# Patient Record
Sex: Female | Born: 1945 | Race: Black or African American | Hispanic: No | Smoking: Never smoker
Health system: Southern US, Community
[De-identification: ages and names within clinical notes are randomized; demographics above are authoritative.]

---

## 2004-11-26 ENCOUNTER — Ambulatory Visit: Payer: Self-pay | Admitting: Obstetrics and Gynecology

## 2005-11-29 ENCOUNTER — Emergency Department: Payer: Self-pay | Admitting: Unknown Physician Specialty

## 2005-12-03 ENCOUNTER — Ambulatory Visit: Payer: Self-pay | Admitting: Obstetrics and Gynecology

## 2005-12-22 ENCOUNTER — Ambulatory Visit: Payer: Self-pay | Admitting: Obstetrics and Gynecology

## 2006-05-25 ENCOUNTER — Ambulatory Visit: Payer: Self-pay

## 2006-10-24 ENCOUNTER — Emergency Department: Payer: Self-pay | Admitting: Emergency Medicine

## 2007-01-18 ENCOUNTER — Ambulatory Visit: Payer: Self-pay | Admitting: Obstetrics and Gynecology

## 2007-03-22 ENCOUNTER — Ambulatory Visit: Payer: Self-pay | Admitting: Gastroenterology

## 2007-06-03 IMAGING — US US CAROTID DUPLEX BILAT
1 series · 17 of 24 positions shown · non-contrast
Comparison: none

REASON FOR EXAM: Bruit
COMMENTS:

[Series 1: us carotid duplex bilat · 17 of 54 slices shown]
[im 1/54]
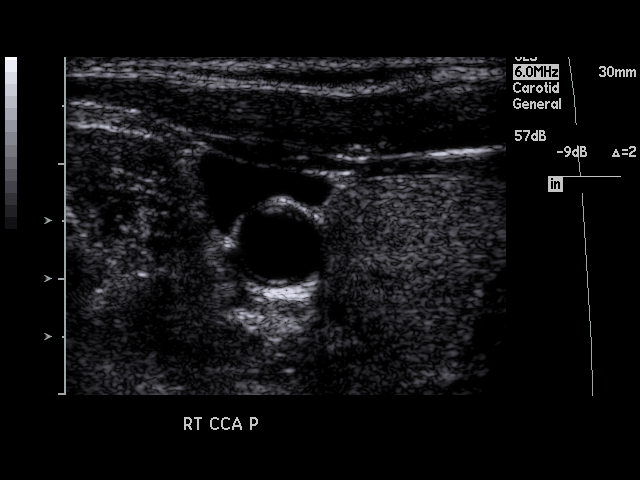
[im 5/54]
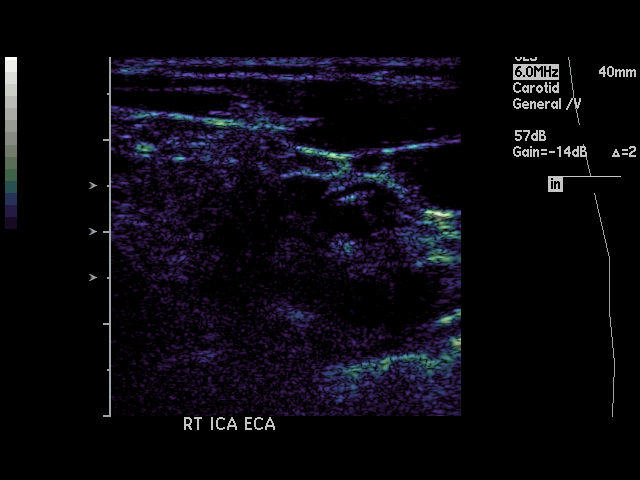
[im 7/54]
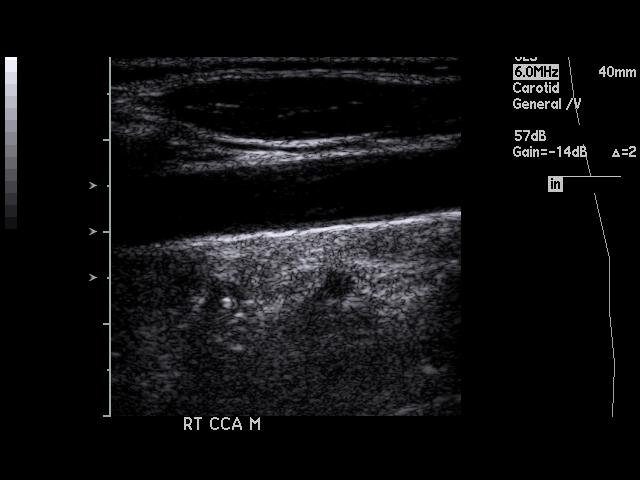
[im 10/54]
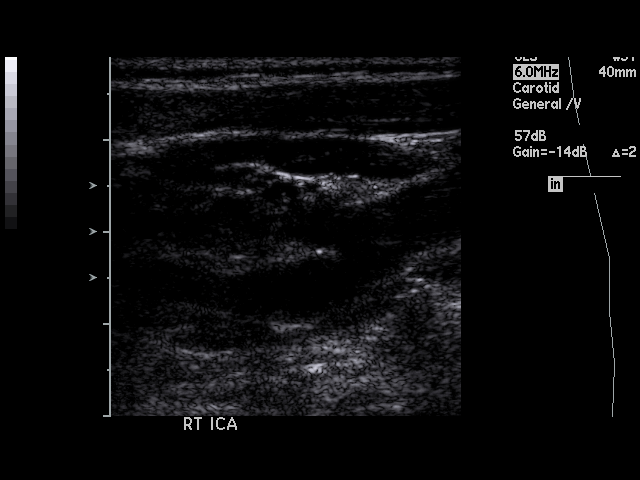
[im 14/54]
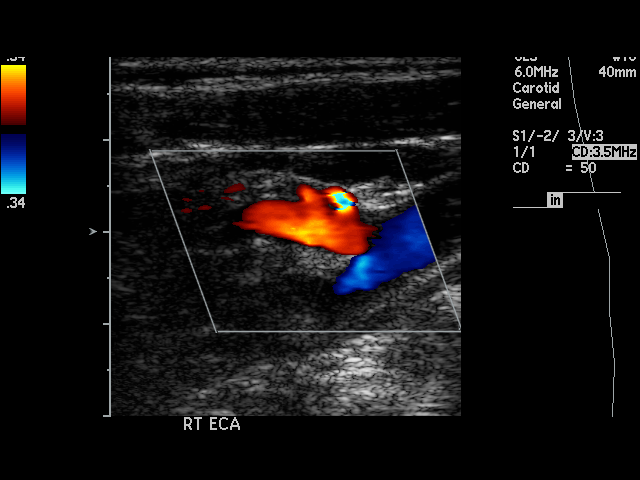
[im 17/54]
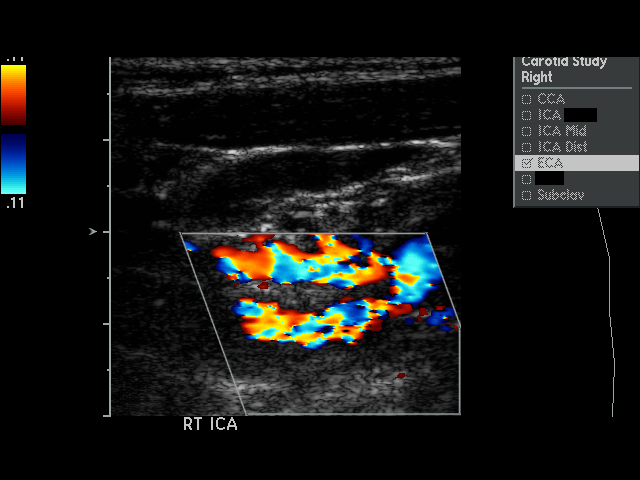
[im 21/54]
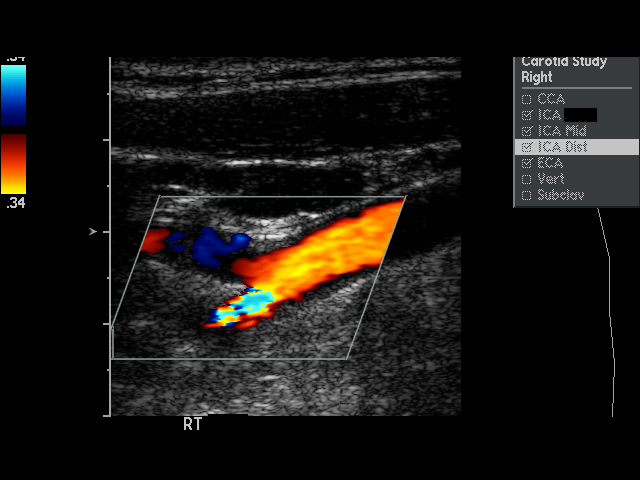
[im 24/54]
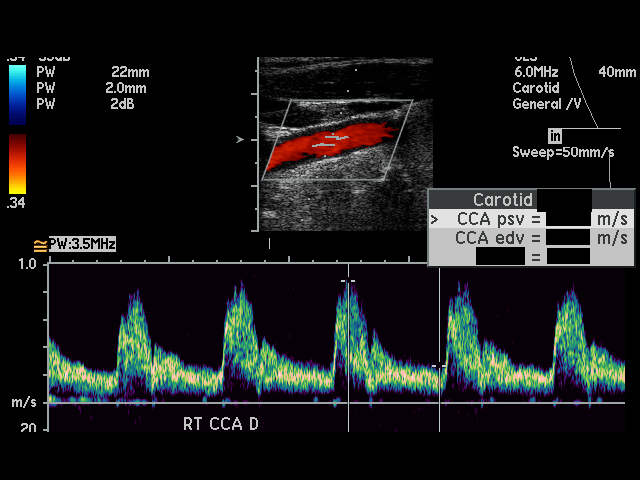
[im 28/54]
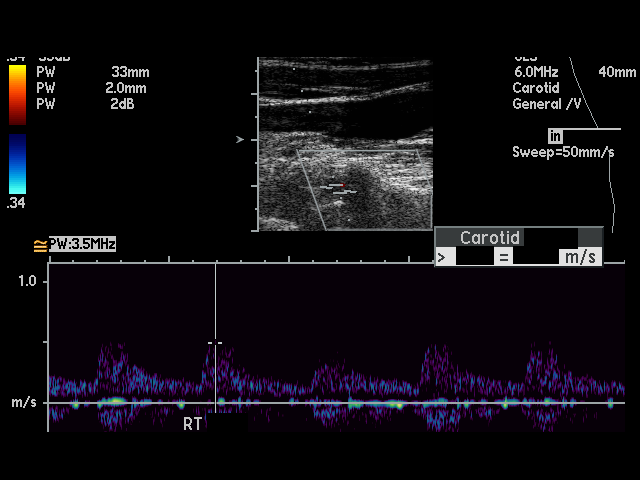
[im 30/54]
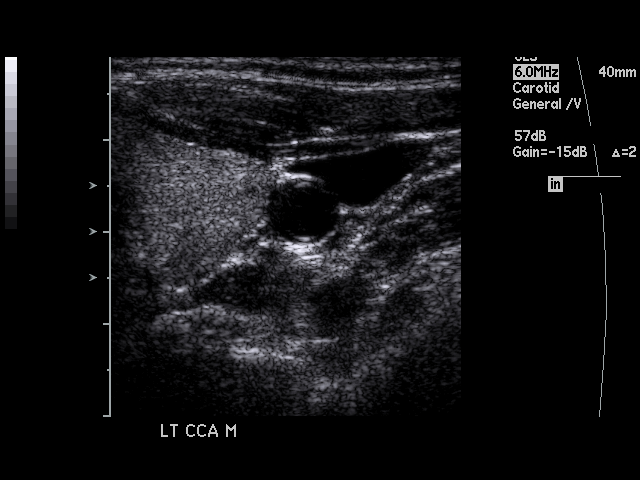
[im 33/54]
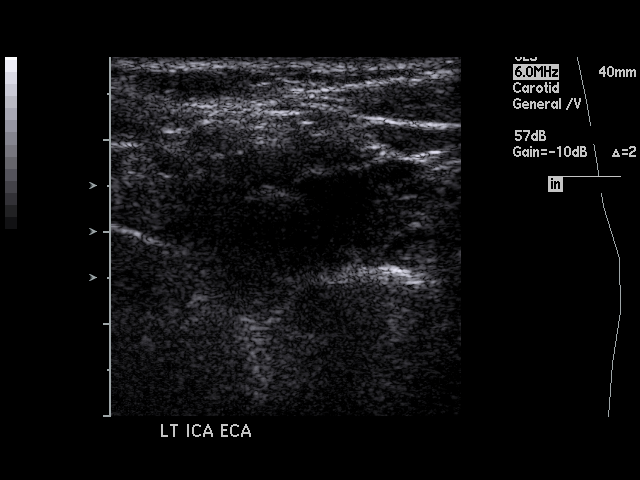
[im 37/54]
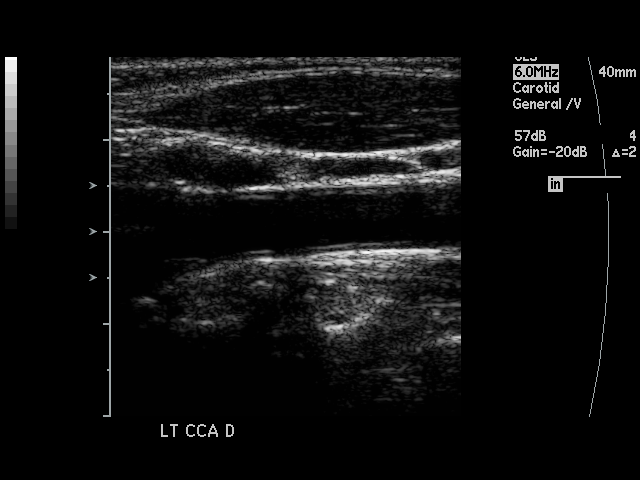
[im 40/54]
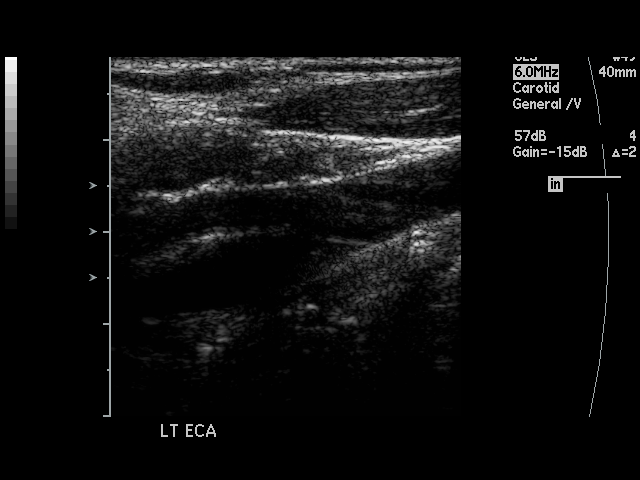
[im 44/54]
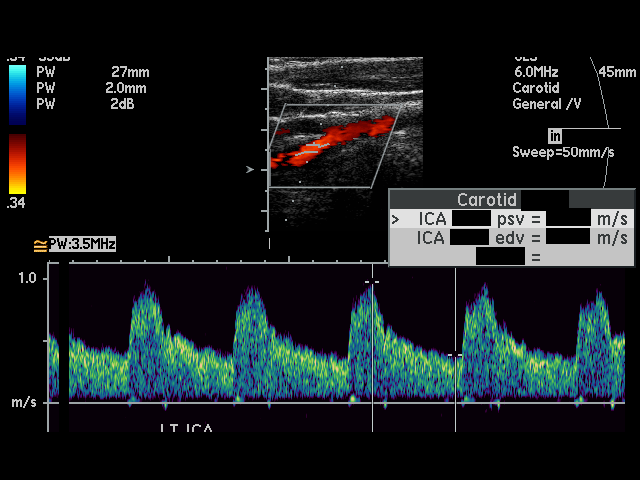
[im 47/54]
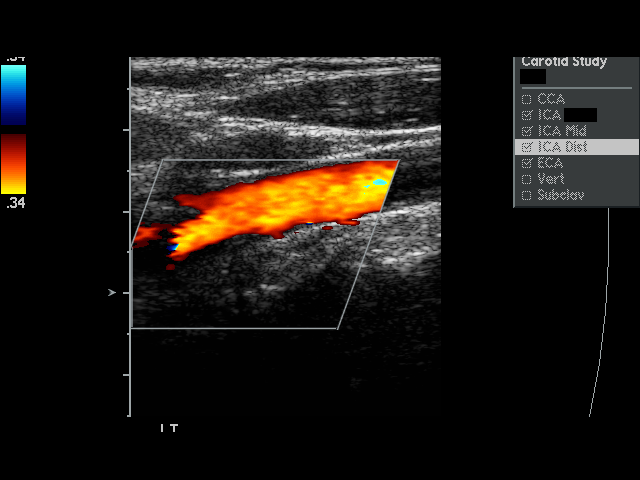
[im 49/54]
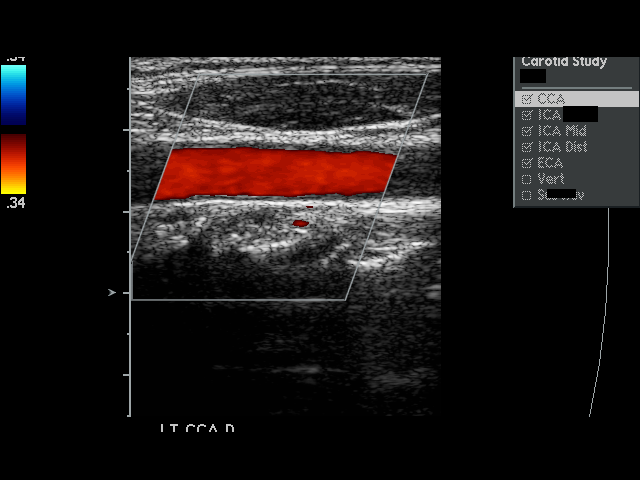
[im 54/54]
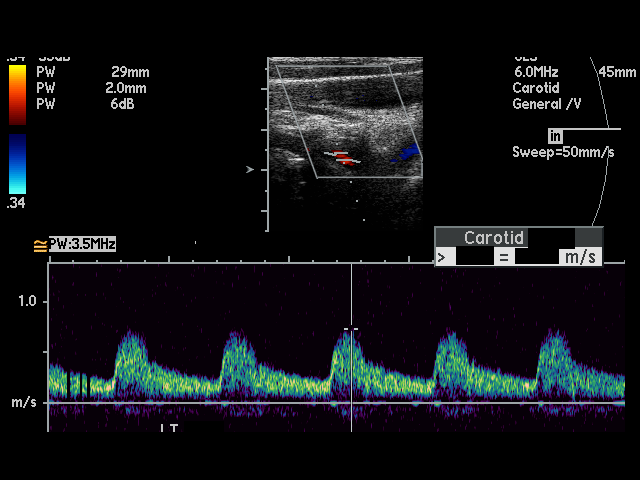

[17 of 24 positions shown; findings below may reference images not displayed]

PROCEDURE:     US  - US CAROTID DOPPLER BILATERAL  - May 25, 2006  [DATE]

RESULT:     There is observed mild soft plaque formation at the carotid
bifurcation on the LEFT. No definite plaque formation is seen on the RIGHT.

On the RIGHT, the peak RIGHT common carotid artery flow velocity measures
0.886 meters per second and the peak RIGHT internal carotid artery flow
velocity measures 1.000 meters per second. The IC:CC ratio is 1.129.

On the LEFT, the peak LEFT common carotid artery flow velocity measures
0.959 meters per second and the peak LEFT internal carotid artery flow
velocity measures 0.979 meters per second. The IC:CC ratio is 1.021.

These values bilaterally are in the normal range and are compatible with the
absence of hemodynamically significant stenosis.

Antegrade flow is seen in both vertebrals.
IMPRESSION: 1.  There is mild soft plaque formation at the carotid bifurcation on the
LEFT.
2.  No dynamically significant stenosis is seen on either side.
3.  Antegrade flow is present in both vertebrals.

## 2008-01-24 ENCOUNTER — Ambulatory Visit: Payer: Self-pay | Admitting: Obstetrics and Gynecology

## 2008-12-14 ENCOUNTER — Ambulatory Visit: Payer: Self-pay | Admitting: Family Medicine

## 2009-01-29 ENCOUNTER — Ambulatory Visit: Payer: Self-pay | Admitting: Obstetrics and Gynecology

## 2009-04-12 ENCOUNTER — Emergency Department: Payer: Self-pay | Admitting: Emergency Medicine

## 2009-04-26 ENCOUNTER — Ambulatory Visit: Payer: Self-pay | Admitting: Family Medicine

## 2009-12-23 IMAGING — CR LEFT WRIST - COMPLETE 3+ VIEW
1 series · 4 of 4 positions shown · non-contrast
Comparison: none

REASON FOR EXAM: injury, pain
COMMENTS:

PROCEDURE:     KDR - KDXR WRIST LT COMP WITH OBLIQUES  - December 14, 2008 [DATE]
RESULT:     Left wrist evaluation dated 12/14/2008

[Series 2: view not recorded · 0.17mm/px · 4 of 4 slices shown]
[im 1/4]
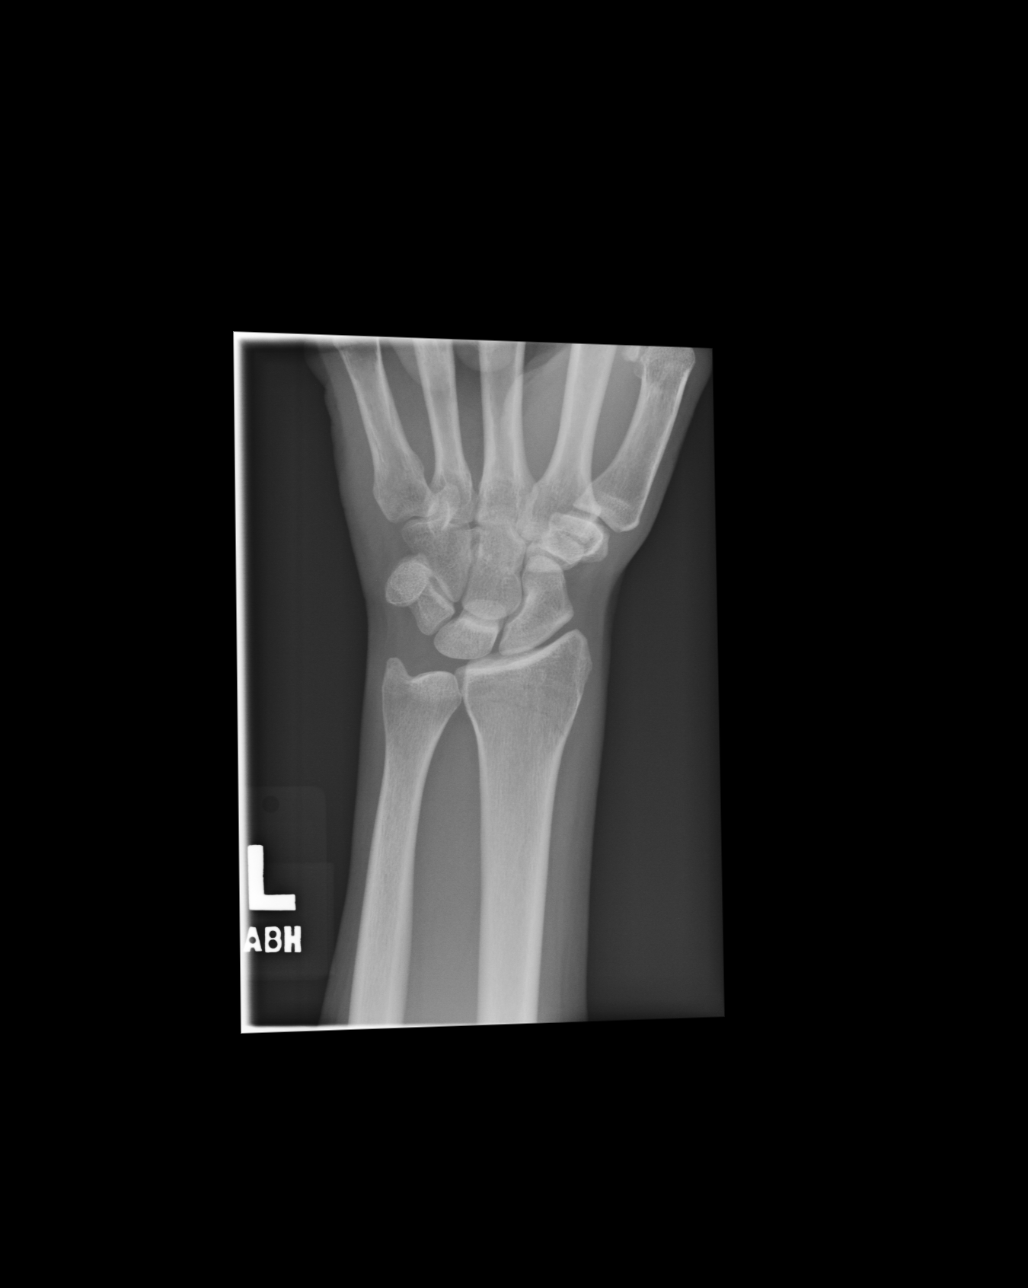
[im 2/4]
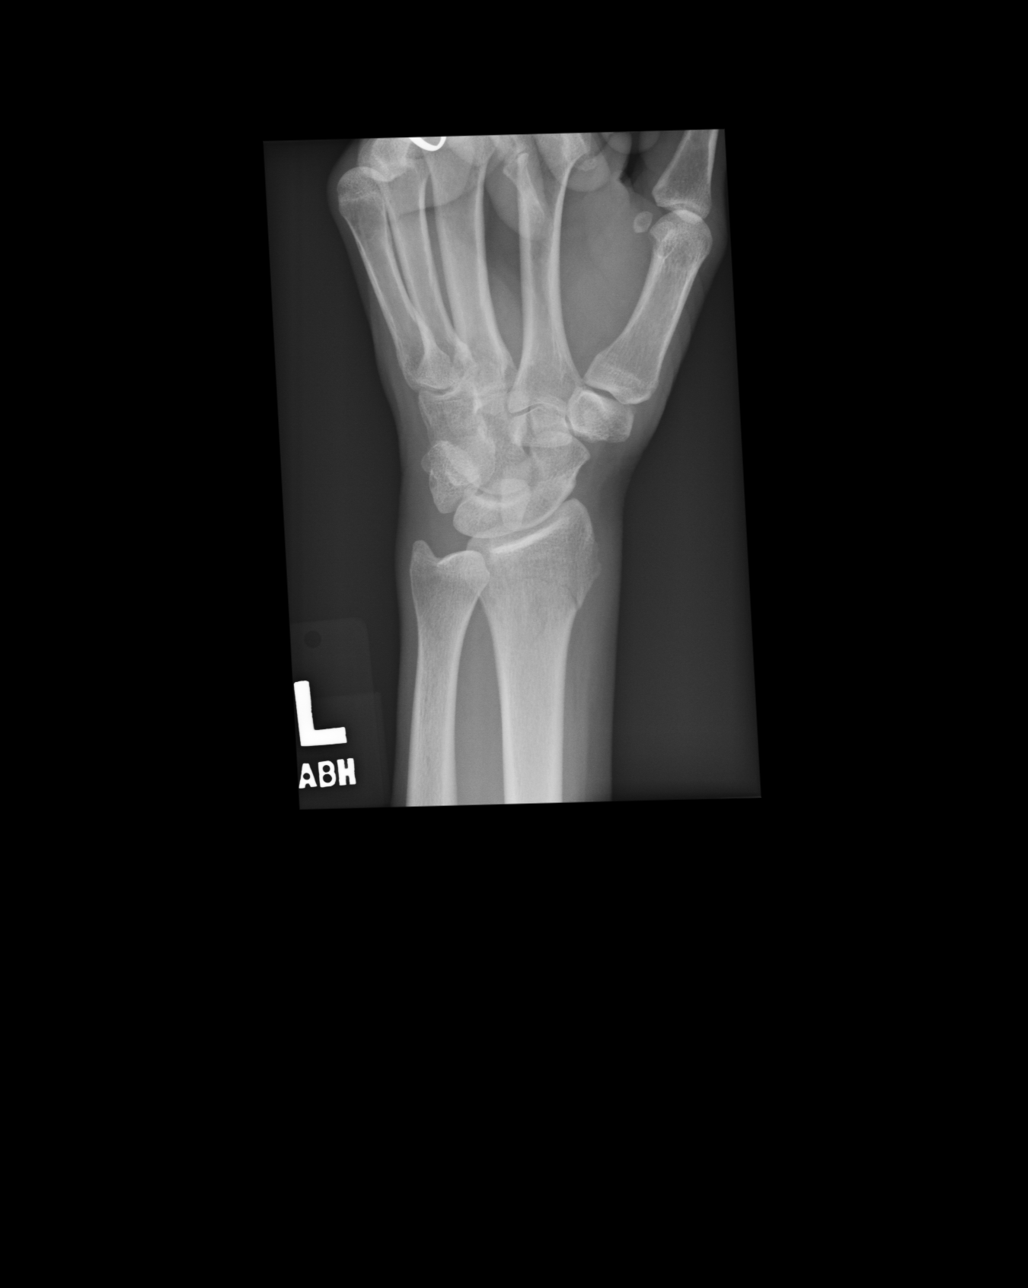
[im 3/4]
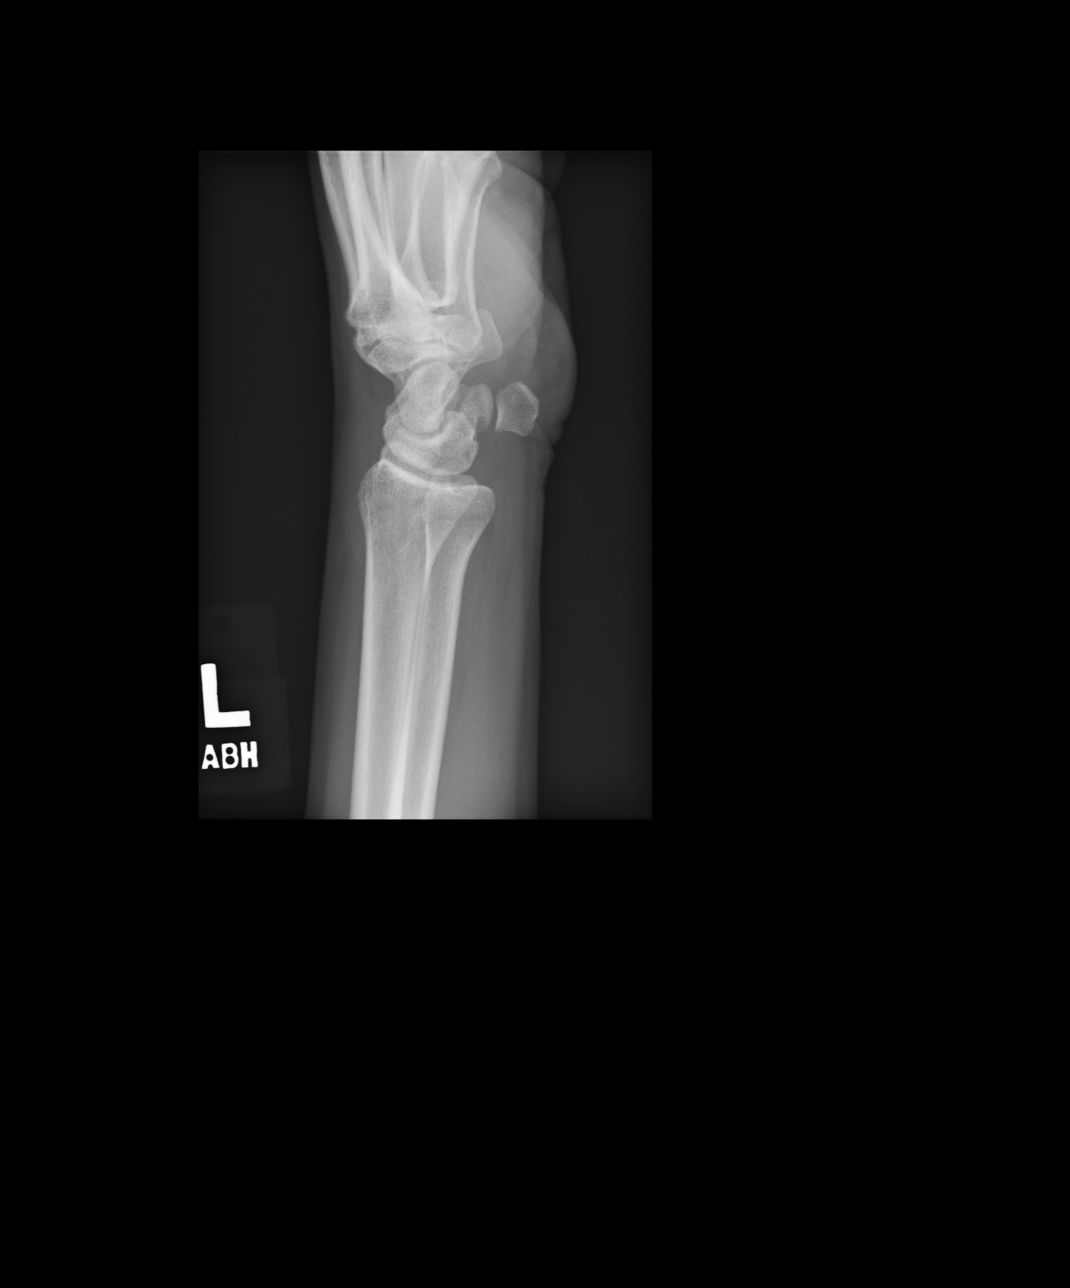
[im 4/4]
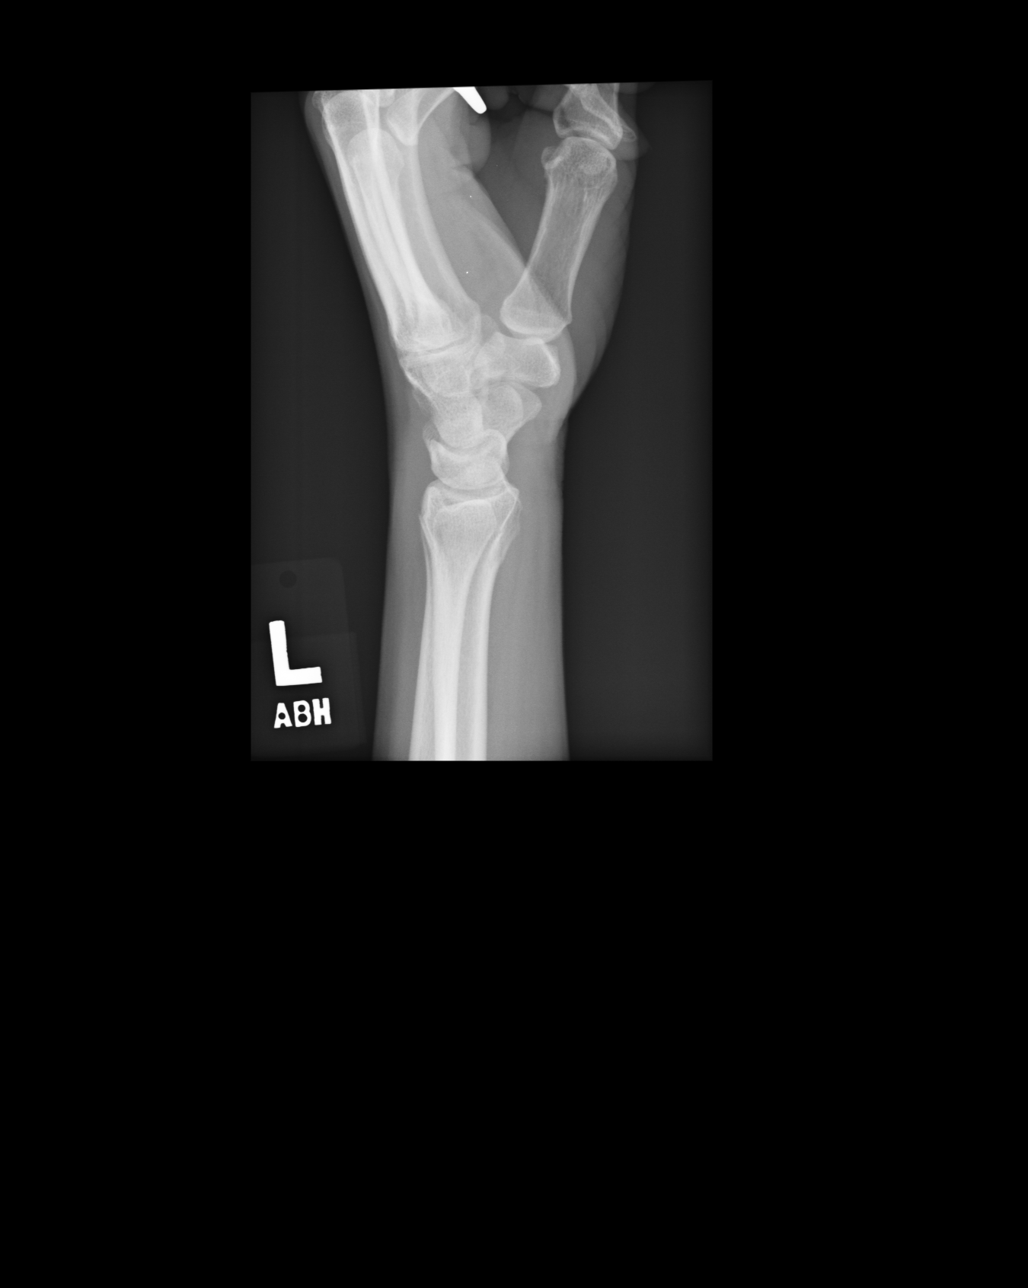

[4 of 4 positions shown; findings below may reference images not displayed]

FINDINGS: A nondisplaced distal radius fracture is appreciated projecting
within the metadiaphyseal region of the radius. No further fracture no
dislocation appreciated.
IMPRESSION: Nondisplaced distal radius fracture.

## 2010-05-05 IMAGING — CR CERVICAL SPINE - 2-3 VIEW
1 series · 5 of 5 positions shown · non-contrast
Comparison: none

REASON FOR EXAM: neck pain
COMMENTS:

[Series 2: view not recorded · 0.17mm/px · 5 of 5 slices shown]
[im 1/5]
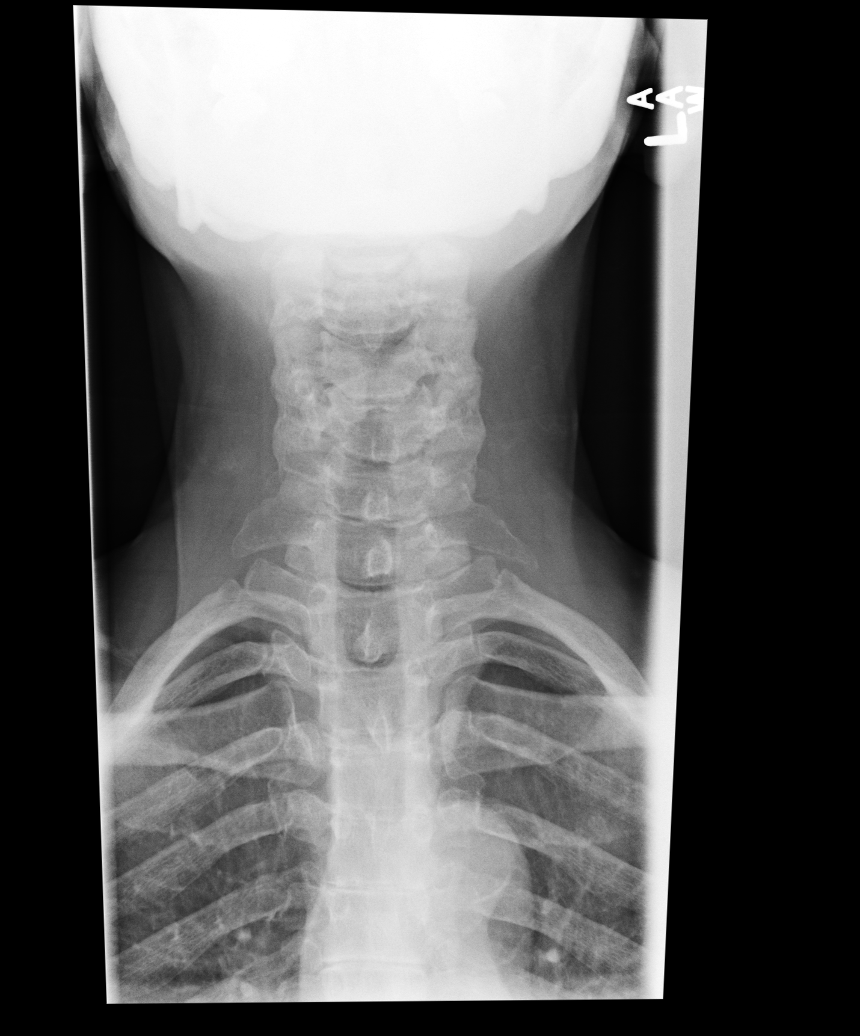
[im 2/5]
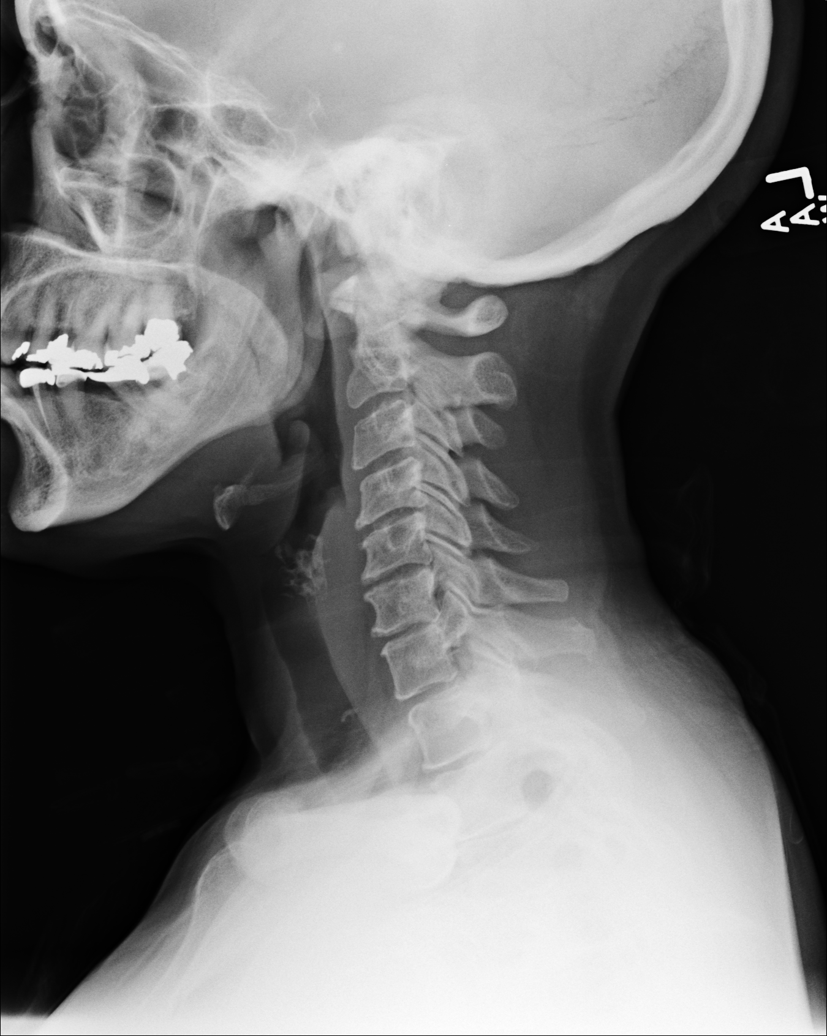
[im 3/5]
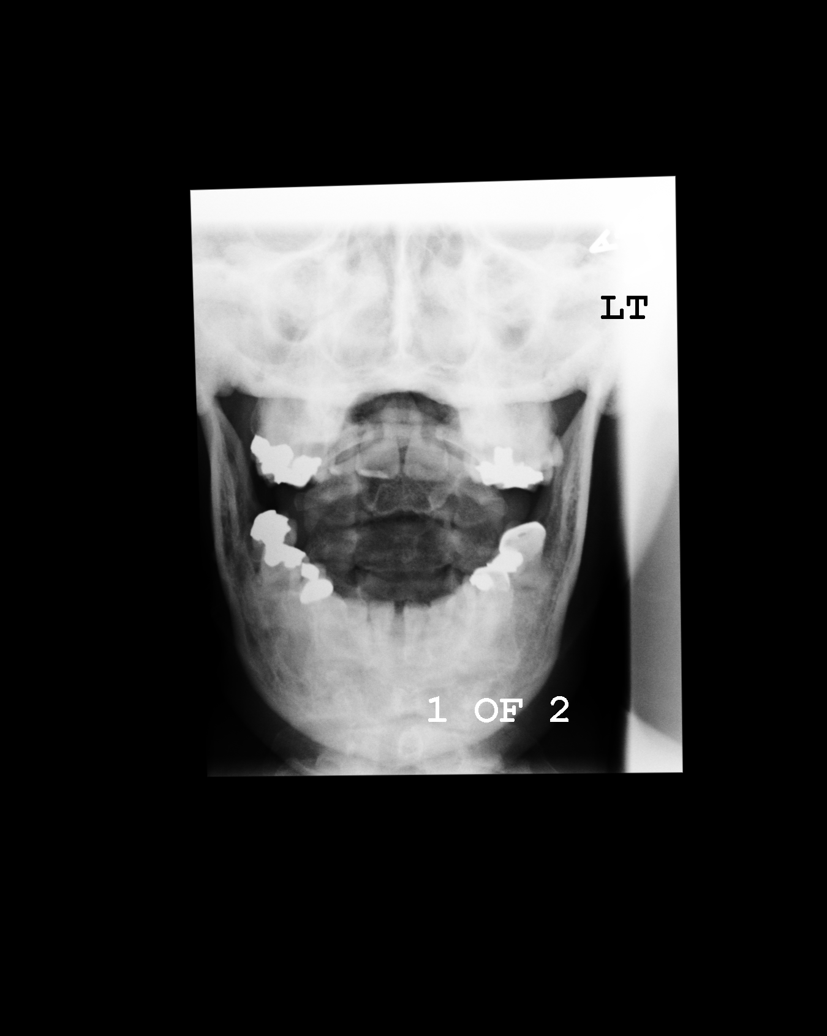
[im 4/5]
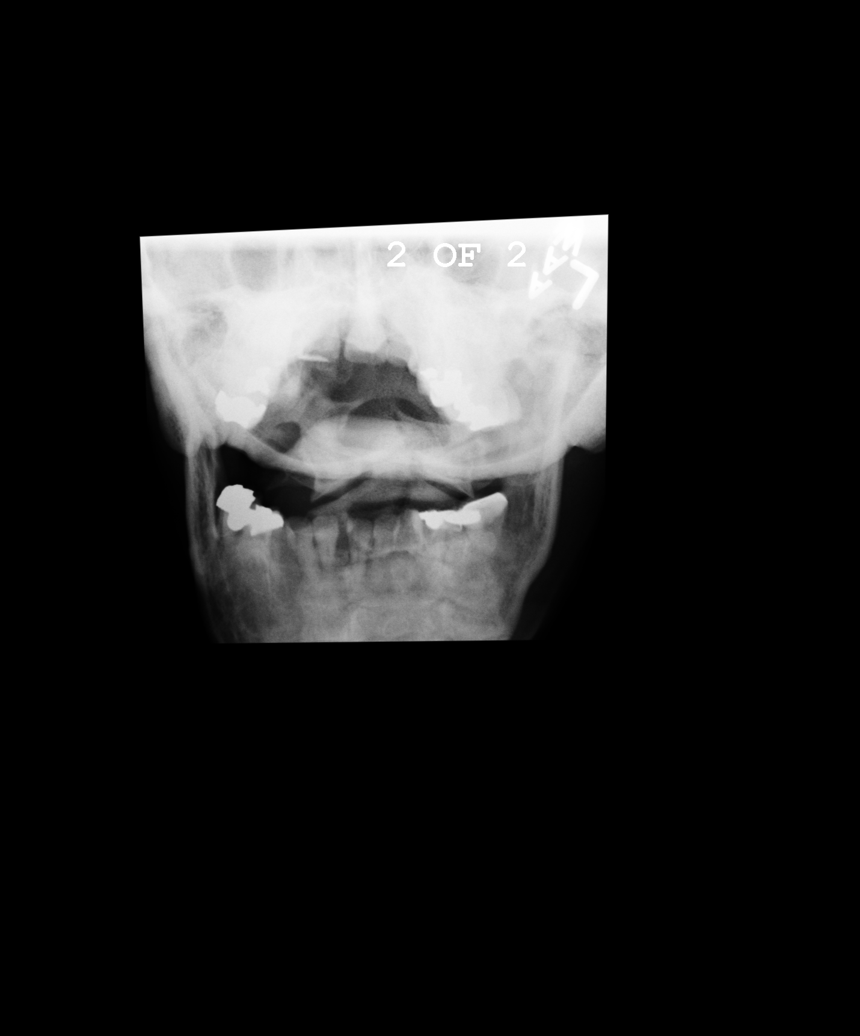
[im 5/5]
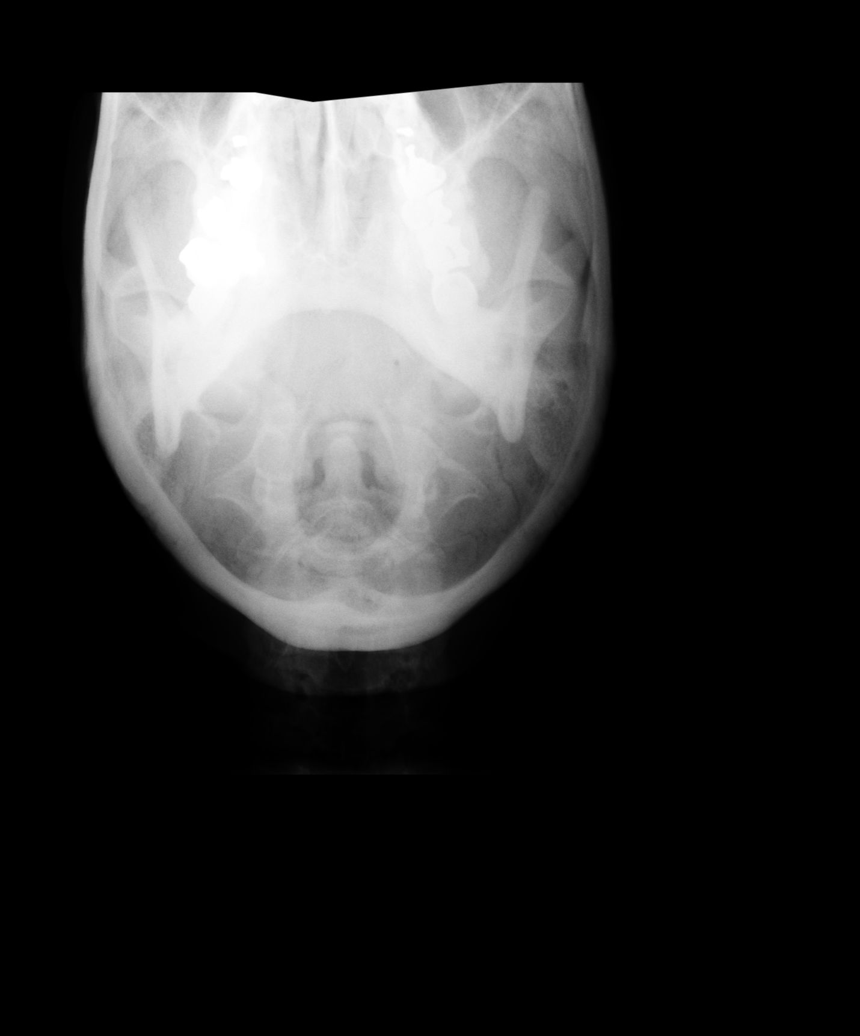

[5 of 5 positions shown; findings below may reference images not displayed]

PROCEDURE:     KDR - KDXR C-SPINE AP AND LATERAL  - April 26, 2009  [DATE]

RESULT:     Comparison is made to study 29 November, 2005.

The cervical vertebral bodies are preserved in height. Mild disc space
narrowing at C4-C5, C5-C6, and C6-C7 is present. The prevertebral soft
tissue spaces are normal. The posterior elements appear intact. The odontoid
is intact.
IMPRESSION: There are mild degenerative disc changes of the mid
cervical spine. Mild progression since the prior study has occurred.
Followup MRI may be useful if there are radicular symptoms.

## 2010-06-17 ENCOUNTER — Ambulatory Visit: Payer: Self-pay

## 2011-08-25 ENCOUNTER — Ambulatory Visit: Payer: Self-pay

## 2011-09-14 ENCOUNTER — Ambulatory Visit: Payer: Self-pay

## 2012-04-12 ENCOUNTER — Ambulatory Visit: Payer: Self-pay | Admitting: Hematology and Oncology

## 2012-04-12 LAB — CBC CANCER CENTER
Eosinophil #: 0 x10 3/mm (ref 0.0–0.7)
HGB: 10.9 g/dL — ABNORMAL LOW (ref 12.0–16.0)
Lymphocyte #: 1.3 x10 3/mm (ref 1.0–3.6)
Lymphocyte %: 22.5 %
MCHC: 33.3 g/dL (ref 32.0–36.0)
MCV: 82 fL (ref 80–100)
Monocyte #: 0.4 x10 3/mm (ref 0.2–0.9)
Neutrophil #: 3.9 x10 3/mm (ref 1.4–6.5)
Neutrophil %: 68.4 %
RBC: 3.99 10*6/uL (ref 3.80–5.20)
WBC: 5.7 x10 3/mm (ref 3.6–11.0)

## 2012-04-12 LAB — COMPREHENSIVE METABOLIC PANEL
Albumin: 3.9 g/dL (ref 3.4–5.0)
Alkaline Phosphatase: 78 U/L (ref 50–136)
BUN: 12 mg/dL (ref 7–18)
Bilirubin,Total: 0.4 mg/dL (ref 0.2–1.0)
Chloride: 97 mmol/L — ABNORMAL LOW (ref 98–107)
EGFR (African American): >60
EGFR (Non-African Amer.): >60
Glucose: 96 mg/dL (ref 65–99)
Osmolality: 268 (ref 275–301)
SGOT(AST): 25 U/L (ref 15–37)
SGPT (ALT): 23 U/L (ref 12–78)
Sodium: 134 mmol/L — ABNORMAL LOW (ref 136–145)

## 2012-04-16 ENCOUNTER — Ambulatory Visit: Payer: Self-pay | Admitting: Hematology and Oncology

## 2012-05-23 ENCOUNTER — Ambulatory Visit: Payer: Self-pay | Admitting: Family Medicine

## 2012-06-14 ENCOUNTER — Ambulatory Visit: Payer: Self-pay | Admitting: Hematology and Oncology

## 2012-06-23 LAB — IRON AND TIBC
Iron Bind.Cap.(Total): 343 ug/dL (ref 250–450)
Iron Saturation: 11 %
Iron: 38 ug/dL — ABNORMAL LOW (ref 50–170)
Unbound Iron-Bind.Cap.: 305 ug/dL

## 2012-06-23 LAB — CBC CANCER CENTER
Eosinophil %: 1.2 %
HGB: 9.9 g/dL — ABNORMAL LOW (ref 12.0–16.0)
Lymphocyte #: 1.7 x10 3/mm (ref 1.0–3.6)
MCH: 27.2 pg (ref 26.0–34.0)
MCHC: 32.9 g/dL (ref 32.0–36.0)
Neutrophil #: 3.5 x10 3/mm (ref 1.4–6.5)
Platelet: 249 x10 3/mm (ref 150–440)
RBC: 3.66 10*6/uL — ABNORMAL LOW (ref 3.80–5.20)
RDW: 15.1 % — ABNORMAL HIGH (ref 11.5–14.5)

## 2012-06-23 LAB — FERRITIN: Ferritin (ARMC): 38 ng/mL (ref 8–388)

## 2012-06-23 LAB — RETICULOCYTES
Absolute Retic Count: 0.0413 10*6/uL
Reticulocyte: 1.13 %

## 2012-07-11 ENCOUNTER — Telehealth: Payer: Self-pay

## 2012-07-11 NOTE — Telephone Encounter (Signed)
Left message for her to call back for appt next door. If her need is urgent, she can come to the urgent care when Dr Katrinka Blazing in, if she needs anything else, she can call me back. Darinda [place]

## 2012-07-11 NOTE — Telephone Encounter (Signed)
Called her again. She is asking about care you gave her in the past in [place]. I advised her you do not have records. She only wants to ask you. She will not give me any further information. I advised her it is my job duty to get this information,she states she will only speak to you.

## 2012-07-11 NOTE — Telephone Encounter (Signed)
Pt is calling to ask Dr. Katrinka Blazing about some previous care she gave her.  Call back number is [phone]

## 2012-07-11 NOTE — Telephone Encounter (Signed)
PT HASN'T BEEN SEEN HERE BEFORE, BUT HAD SEEN DR Katrinka Blazing IN [place]. PLEASE CALL [phone]

## 2012-07-14 ENCOUNTER — Ambulatory Visit: Payer: Self-pay | Admitting: Hematology and Oncology

## 2012-08-31 ENCOUNTER — Ambulatory Visit: Payer: Self-pay | Admitting: Hematology and Oncology

## 2012-09-14 ENCOUNTER — Ambulatory Visit: Payer: Self-pay | Admitting: Family Medicine

## 2019-04-21 ENCOUNTER — Ambulatory Visit: Payer: Medicare HMO | Attending: Internal Medicine

## 2019-04-21 DIAGNOSIS — Z23 Encounter for immunization: Secondary | ICD-10-CM | POA: Insufficient documentation

## 2019-04-21 NOTE — Progress Notes (Signed)
   Covid-19 Vaccination Clinic  Name:  Irasema Chalk    MRN: [id number] DOB: 21-May-1945  04/21/2019  Ms. Montalvo was observed post Covid-19 immunization for 15 minutes without incidence. She was provided with Vaccine Information Sheet and instruction to access the V-Safe system.   Ms. Schoening was instructed to call 911 with any severe reactions post vaccine: Marland Kitchen Difficulty breathing  . Swelling of your face and throat  . A fast heartbeat  . A bad rash all over your body  . Dizziness and weakness    Immunizations Administered    Name Date Dose VIS Date Route   Pfizer COVID-19 Vaccine 04/21/2019 11:08 AM 0.3 mL 02/24/2019 Intramuscular   Manufacturer: [employer], [employer]   Lot: [id number]   NDC: [address]-7

## 2019-05-16 ENCOUNTER — Ambulatory Visit: Payer: Medicare HMO | Attending: Internal Medicine

## 2019-05-16 DIAGNOSIS — Z23 Encounter for immunization: Secondary | ICD-10-CM

## 2019-05-16 NOTE — Progress Notes (Signed)
   Covid-19 Vaccination Clinic  Name:  Natalie Mullins    MRN: [id number] DOB: 07-05-1945  05/16/2019  [name] was observed post Covid-19 immunization for 15 minutes without incident. She was provided with Vaccine Information Sheet and instruction to access the V-Safe system.   [name] was instructed to call 911 with any severe reactions post vaccine: Marland Kitchen Difficulty breathing  . Swelling of face and throat  . A fast heartbeat  . A bad rash all over body  . Dizziness and weakness   Immunizations Administered    Name Date Dose VIS Date Route   Pfizer COVID-19 Vaccine 05/16/2019 10:59 AM 0.3 mL 02/24/2019 Intramuscular   Manufacturer: [employer], [employer]   Lot: [id number]   NDC: [address]-9

## 2023-04-16 ENCOUNTER — Other Ambulatory Visit: Payer: Self-pay

## 2023-04-16 ENCOUNTER — Emergency Department
Admission: EM | Admit: 2023-04-16 | Discharge: 2023-04-17 | Disposition: A | Discharge status: Home / Self Care | Payer: Medicare HMO | Attending: Emergency Medicine | Admitting: Emergency Medicine

## 2023-04-16 ENCOUNTER — Emergency Department: Payer: Medicare HMO

## 2023-04-16 DIAGNOSIS — R55 Syncope and collapse: Secondary | ICD-10-CM | POA: Insufficient documentation

## 2023-04-16 DIAGNOSIS — Z20822 Contact with and (suspected) exposure to covid-19: Secondary | ICD-10-CM | POA: Diagnosis not present

## 2023-04-16 DIAGNOSIS — N3000 Acute cystitis without hematuria: Secondary | ICD-10-CM | POA: Insufficient documentation

## 2023-04-16 LAB — URINALYSIS, ROUTINE W REFLEX MICROSCOPIC
Bilirubin Urine: NEGATIVE
Glucose, UA: NEGATIVE mg/dL
Hgb urine dipstick: NEGATIVE
Ketones, ur: 5 mg/dL — AB
Nitrite: NEGATIVE
Protein, ur: NEGATIVE mg/dL
Specific Gravity, Urine: 1.018 (ref 1.005–1.030)
pH: 6 (ref 5.0–8.0)

## 2023-04-16 LAB — BASIC METABOLIC PANEL
Anion gap: 12 (ref 5–15)
BUN: 15 mg/dL (ref 8–23)
CO2: 20 mmol/L — ABNORMAL LOW (ref 22–32)
Calcium: 9.3 mg/dL (ref 8.9–10.3)
Chloride: 100 mmol/L (ref 98–111)
Creatinine, Ser: 0.73 mg/dL (ref 0.44–1.00)
GFR, Estimated: >60 mL/min (ref >60)
Glucose, Bld: 97 mg/dL (ref 70–99)
Potassium: 3.6 mmol/L (ref 3.5–5.1)
Sodium: 132 mmol/L — ABNORMAL LOW (ref 135–145)

## 2023-04-16 LAB — RESP PANEL BY RT-PCR (RSV, FLU A&B, COVID)  RVPGX2
Influenza A by PCR: NEGATIVE
Influenza B by PCR: NEGATIVE
Resp Syncytial Virus by PCR: NEGATIVE
SARS Coronavirus 2 by RT PCR: NEGATIVE

## 2023-04-16 LAB — CBC
HCT: 31.4 % — ABNORMAL LOW (ref 36.0–46.0)
Hemoglobin: 10.6 g/dL — ABNORMAL LOW (ref 12.0–15.0)
MCH: 27.7 pg (ref 26.0–34.0)
MCHC: 33.8 g/dL (ref 30.0–36.0)
MCV: 82 fL (ref 80.0–100.0)
Platelets: 264 10*3/uL (ref 150–400)
RBC: 3.83 MIL/uL — ABNORMAL LOW (ref 3.87–5.11)
RDW: 14.3 % (ref 11.5–15.5)
WBC: 12.2 10*3/uL — ABNORMAL HIGH (ref 4.0–10.5)
nRBC: 0 % (ref 0.0–0.2)

## 2023-04-16 LAB — TROPONIN I (HIGH SENSITIVITY): Troponin I (High Sensitivity): 7 ng/L (ref <18)

## 2023-04-16 LAB — CBG MONITORING, ED: Glucose-Capillary: 136 mg/dL — ABNORMAL HIGH (ref 70–99)

## 2023-04-16 NOTE — ED Triage Notes (Signed)
Pt here via [facility] with hypotension. Pt has also been weak. Pt was at a funeral and began to feel weak and then fainted. Pt denies any CP or SOB. Pt has a 20G LAC.

## 2023-04-16 NOTE — ED Notes (Signed)
Ambulated pt. Pt ambulated well with no dificulty. Pt denies SOB, dizziness, and headache. Orthostatic vitals are BP: 157/47 (80), SPO2: 100 on RA, HR: 70, Respirations: 26.

## 2023-04-16 NOTE — ED Notes (Signed)
Patient in triage middle waiting on blood to be drawn. Lab notified and beside patient drawing blood from another patient

## 2023-04-16 NOTE — ED Triage Notes (Signed)
First nurse note:Arrived by [employer] from funeral home. EMS called out for syncopal episode. Slow to respond  EMS vitals: 58/30b/p -  1mg  atrapine & 500cc then b/p 122/50 84HR

## 2023-04-17 MED ORDER — FOSFOMYCIN TROMETHAMINE 3 G PO PACK
3.0000 g | PACK | Freq: Once | ORAL | Status: AC
  Administered 2023-04-17: 3 g via ORAL
  Filled 2023-04-17: qty 3

## 2023-04-17 NOTE — Discharge Instructions (Signed)
You have been seen today in the Emergency Department (ED)  for syncope (passing out).  Your workup including labs and EKG show reassuring results.  Your symptoms may be due to dehydration, so it is important that you drink plenty of non-alcoholic fluids.  You may have a mild urinary tract infection.  We treated you with a one-time dose of antibiotic called fosfomycin.  That is usually all you need, but we also sent a urine culture and you can follow-up with your primary care doctor next week to determine if you need additional antibiotics.  You can bring them this paperwork to help explain the situation.  Please call your regular doctor as soon as possible to schedule the next available clinic appointment to follow up with him/her regarding your visit to the ED and your symptoms.  Return to the Emergency Department (ED)  if you have any further syncopal episodes (pass out again) or develop ANY chest pain, pressure, tightness, trouble breathing, sudden sweating, or other symptoms that concern you.

## 2023-04-17 NOTE — ED Provider Notes (Signed)
[facility] Provider Note    Event Date/Time   First MD Initiated Contact with Patient 04/16/23 2257     (approximate)   History   Hypotension   HPI Natalie Mullins is a 78 y.o. female who is generally healthy and active for her age.  She presents tonight by EMS after an episode of syncope immediately after a funeral.  She reports that she was at the funeral of her daughter-in-law and there were a lot of people around and she became quite hot.  When she got up and was walking out she passed out.  She did not hit head or sustain any other injuries.  She had no chest pain or shortness of breath.  She has felt fine ever since the incident.  EMS reports that her initial blood pressure was quite low at about 58/30 and they did not document her heart rate but they reportedly gave her 1 mg of atropine as well as a 500 mL normal saline bolus.  After that her blood pressure was normal and her heart rate was in the 80s.  She has not felt ill recently and says she is doing just fine.     Physical Exam   Triage Vital Signs: ED Triage Vitals  Encounter Vitals Group     BP 04/16/23 1559 (!) 102/51     Systolic BP Percentile --      Diastolic BP Percentile --      Pulse Rate 04/16/23 1559 69     Resp 04/16/23 1559 16     Temp 04/16/23 1601 (!) 97.5 F (36.4 C)     Temp Source 04/16/23 1601 Axillary     SpO2 04/16/23 1559 100 %     Weight 04/16/23 1600 52.2 kg (115 lb)     Height 04/16/23 1600 1.626 m (5\' 4" )     Head Circumference --      Peak Flow --      Pain Score 04/16/23 1600 0     Pain Loc --      Pain Education --      Exclude from Growth Chart --     Most recent vital signs: Vitals:   04/16/23 1601 04/16/23 2219  BP:  (!) 125/51  Pulse:  (!) 59  Resp:  17  Temp: (!) 97.5 F (36.4 C) 98.1 F (36.7 C)  SpO2:  100%    General: Awake, no distress.  Oriented, conversant, quite well-appearing and appears younger than chronological age. CV:  Good  peripheral perfusion.  Regular rate and rhythm with normal heart sounds. Resp:  Normal effort. Speaking easily and comfortably, no accessory muscle usage nor intercostal retractions.  Lungs are clear to auscultation bilaterally. Abd:  No distention.  No tenderness to palpation of the abdomen.   ED Results / Procedures / Treatments   Labs (all labs ordered are listed, but only abnormal results are displayed) Labs Reviewed  BASIC METABOLIC PANEL - Abnormal; Notable for the following components:      Result Value   Sodium 132 (*)    CO2 20 (*)    All other components within normal limits  CBC - Abnormal; Notable for the following components:   WBC 12.2 (*)    RBC 3.83 (*)    Hemoglobin 10.6 (*)    HCT 31.4 (*)    All other components within normal limits  URINALYSIS, ROUTINE W REFLEX MICROSCOPIC - Abnormal; Notable for the following components:   Color, Urine YELLOW (*)  APPearance HAZY (*)    Ketones, ur 5 (*)    Leukocytes,Ua MODERATE (*)    Bacteria, UA RARE (*)    All other components within normal limits  CBG MONITORING, ED - Abnormal; Notable for the following components:   Glucose-Capillary 136 (*)    All other components within normal limits  RESP PANEL BY RT-PCR (RSV, FLU A&B, COVID)  [id number]  URINE CULTURE  TROPONIN I (HIGH SENSITIVITY)     EKG  ED ECG REPORT I, Loleta Rose, the attending physician, personally viewed and interpreted this ECG.  Date: 04/16/2023 EKG Time: 16: 05 Rate: 69 Rhythm: Sinus rhythm with first-degree AV block QRS Axis: normal Intervals: Left bundle branch block ST/T Wave abnormalities: Non-specific ST segment / T-wave changes, but no clear evidence of acute ischemia. Narrative Interpretation: no definitive evidence of acute ischemia; does not meet STEMI criteria.  Of note, I have no EKGs in [facility] against which to compare, but I looked in care everywhere and see the report from an EKG at [facility] from 04/06/2017 in which a left bundle branch  was documented.    RADIOLOGY I viewed and interpreted the patient's two-view chest x-ray and there is no evidence of pneumonia or other acute abnormality.  I also read the radiologist's report, which confirmed no acute findings.   PROCEDURES:  Critical Care performed: No  Procedures    IMPRESSION / MDM / ASSESSMENT AND PLAN / ED COURSE  I reviewed the triage vital signs and the nursing notes.                              Differential diagnosis includes, but is not limited to, nonspecific syncope, vasovagal episode, orthostatic syncope, volume depletion, electrolyte or metabolic abnormality, acute infection such as UTI.  Patient's presentation is most consistent with acute presentation with potential threat to life or bodily function.  Labs/studies ordered: EKG, two-view chest x-ray, CBC, basic metabolic panel, high-sensitivity troponin, urinalysis, urine culture, respiratory viral panel given the prevalence of influenza and the fact that acute viral illness could lead to a syncopal episode  Interventions/Medications given:  Medications  fosfomycin (MONUROL) packet 3 g (has no administration in time range)    (Note:  hospital course my include additional interventions and/or labs/studies not listed above.)   Patient has been stable and asymptomatic for 9 hours in the emergency department.  Vital signs have normalized.  She might been slightly volume depleted and the fluid bolus from EMS helped.  I think most likely, however, she had a vasovagal episode after the funeral.  Labs are all essentially normal although she has a very mild leukocytosis, very mild hyponatremia, and possible UTI.  It could simply be some bacteriuria, but the patient agrees with the plan for a dose of fosfomycin and a urine culture.  She knows to follow-up with her primary care doctor to check the urine culture results to determine if she needs additional antibiotic treatment.  However, given her overall well  appearance and minimal symptoms, I think fosfomycin would be a good choice for her.  Patient and her son who is at bedside understand and agree with the plan.  I initially considered hospitalization for syncope, but given her stability over 9 hours and the strong possibility that this was a vasovagal episode in the setting of mild volume depletion which has been addressed, I think she is appropriate for discharge and outpatient follow-up.  That is very much  what the patient wants as well.  I gave my usual management recommendations and return precautions.       FINAL CLINICAL IMPRESSION(S) / ED DIAGNOSES   Final diagnoses:  Syncope, unspecified syncope type  Acute cystitis without hematuria     Rx / DC Orders   ED Discharge Orders     None        Note:  This document was prepared using Dragon voice recognition software and may include unintentional dictation errors.   Loleta Rose, MD 04/17/23 [phone]

## 2023-04-18 LAB — URINE CULTURE

## 2023-04-28 NOTE — Progress Notes (Signed)
Viral symptoms and concerning for COVID or influenza

## 2024-01-24 NOTE — Progress Notes (Signed)
 Assessment and Plan:   Assessment & Plan Essential hypertension Blood pressure initially elevated, improved with rest. No symptoms. Current medications effective. - Continue amlodipine 10 mg daily, hydrochlorothiazide 25 mg daily, enalapril 10 mg twice daily. - Monitor blood pressure at home, report if consistently elevated. - Encouraged regular exercise 3-4 times a week. - Advised on dietary sodium intake.  Hyperlipidemia Cholesterol levels normal as of July. Pravastatin effective. - Continue pravastatin 40 mg daily. - Maintain dietary modifications.  Hearing loss Hearing impairment likely from occupational noise. Cost of hearing aids a concern. - Consider affordable hearing aids. - Encouraged use of hearing aids for communication and quality of life.       Essential hypertension   Hyperlipidemia, unspecified hyperlipidemia type   Screening mammogram for breast cancer - Mammo Screening Bilateral Tomo; Future  Noise-induced hearing loss of both ears       At onset of visit I advised patient I am using a new tool to record our discussion today. The recording would write down what we discuss and once reviewed and finalized would become a part of the chart note for today's visit. Patient amenable to recording.   I personally spent 30 minutes face-to-face and non-face-to-face in the care of this patient, which includes all pre, intra, and post visit time on the date of service.  Return in about 4 months (around 05/23/2024) for Next scheduled follow up.  HPI:    Natalie Mullins is here for  Chief Complaint  Patient presents with   Hypertension   Hyperlipidemia        History of Present Illness Natalie Mullins is a 78 year old female with hypertension and hyperlipidemia who presents for a follow-up visit. She is accompanied by her son.  Her blood pressure readings at home are generally well-controlled, especially after cycling, although the systolic number was  164 this morning. She takes hydrochlorothiazide 25 mg once daily, amlodipine 10 mg once daily, and enalapril 10 mg twice daily. No blurred vision, pressure in the head, chest pain, shortness of breath, or swelling in her feet or ankles.  Her cholesterol is managed with pravastatin 40 mg once daily. She reports no side effects from her medications and adheres to her medication regimen daily.  She exercises by cycling, though not daily, and is mindful of her diet, avoiding excessive salt and cholesterol.  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 Hearing aids have been recommended, but cost is a concern. Her son and daughter-in-law are exploring more affordable options.  Her son expresses concern about her eating habits, noting she typically eats cereal with bananas around noon and then not again until dinner. She is encouraging her to eat more frequently, suggesting small meals or protein-rich snacks like sandwiches or protein shakes.   ROS:    Comprehensive 10 point ROS negative unless otherwise stated in the HPI.    [facility] Components:   Medication adherence and barriers to the treatment plan have been addressed. Opportunities to optimize healthy behaviors have been discussed. Patient / caregiver voiced understanding.  Past Medical/Surgical History:   Past Medical History[1] Past Surgical History[2]  Family History:   Family History[3]  Social History:   Short Social History[4]  Allergies:   Penicillins  Current Medications:   Current Medications[5]  Health Maintenance:   Health Maintenance  Topic Date Due   DEXA Scan  05/23/2024   COVID-19 Vaccine (6 - 2025-26 season) 06/01/2024   Medicare Annual  Wellness Visit (AWV)  07/28/2024   Serum Creatinine Monitoring  09/19/2024   Potassium Monitoring  09/19/2024   DTaP/Tdap/Td Vaccines (3 - Td or Tdap) 11/29/2024   Pneumococcal Vaccine 50+  Completed    Hepatitis C Screen  Completed   Influenza Vaccine  Completed   Zoster Vaccines  Completed   Colonoscopy  Discontinued    Immunizations:   Immunization History  Administered Date(s) Administered   COVID-19 VAC,BIVALENT(17YR UP),PFIZER 03/13/2021   COVID-19 VACC,MRNA,(PFIZER)(PF) 04/21/2019, 05/16/2019, 12/13/2019   Covid-19 Vac, (56yr+) (Comirnaty) Mrna Pfizer  12/03/2023   INFLUENZA IIV3 HIGH DOSE 7YRS+(FLUZONE) 02/22/2023, 12/03/2023   INFLUENZA INJ MDCK PF, QUAD,(FLUCELVAX)(25MO AND UP EGG FREE) 12/25/2019, 01/28/2021   INFLUENZA QUAD HIGH DOSE 7YRS+(FLUZONE) 01/18/2013, 12/18/2014   Influenza Vaccine Quad(IM)6 MO-Adult(PF) 03/27/2014, 03/31/2016, 12/18/2016, 01/25/2018, 01/27/2019, 12/12/2021   PNEUMOCOCCAL POLYSACCHARIDE 23-VALENT 12/08/2012   Pneumococcal Conjugate 13-Valent 12/18/2014   SHINGRIX-ZOSTER VACCINE (HZV),RECOMBINANT,ADJUVANTED(IM) 07/27/2017, 10/19/2017   TD(TDVAX),ADSORBED,2LF(IM)(PF) 02/01/1998   TdaP 11/30/2014   I have reviewed and (if needed) updated the patient's problem list, medications, allergies, past medical and surgical history, social and family history.   Vital Signs:   Wt Readings from Last 3 Encounters:  01/24/24 48.1 kg (106 lb)  09/20/23 48.1 kg (106 lb)  07/21/23 48.8 kg (107 lb 8 oz)   Temp Readings from Last 3 Encounters:  01/24/24 36.7 C (98 F) (Oral)  09/20/23 36.4 C (97.6 F) (Oral)  07/21/23 36.6 C (97.9 F) (Oral)   BP Readings from Last 3 Encounters:  01/24/24 164/58  09/20/23 124/52  07/21/23 136/58   Pulse Readings from Last 3 Encounters:  01/24/24 74  09/20/23 60  07/21/23 65   Estimated body mass index is 18.78 kg/m as calculated from the following:   Height as of this encounter: 160 cm (5' 3).   Weight as of this encounter: 48.1 kg (106 lb). Facility age limit for growth %iles is 20 years.   Objective:    General: Alert and oriented x3. Well-appearing. No acute distress.  HEENT:   Normocephalic.  Atraumatic. Conjunctiva and sclera normal. OP MMM without lesions.  Neck:  Supple. No thyroid enlargement. No adenopathy.  Heart:  Regular rate and rhythm. Normal S1, S2. No murmurs, rubs or gallops.  Lungs:  No respiratory distress.  Lungs clear to auscultation. No wheezes, rhonchi, or rales.  Extremities:  No edema. Peripheral pulses normal.  Skin:  Warm, dry. No rash or lesions present.  Neuro:  Non-focal. No obvious weakness.  Psych:  Affect normal, eye contact good, speech clear and coherent.      Results LABS Cholesterol: Within normal limits (09/2023) Renal function: Normal (09/2023) Hepatic function: Normal (09/2023) Electrolytes: Hyponatremia (09/2023)     Natalie LELON Snide, FNP        [1] Past Medical History: Diagnosis Date   Anemia    Hearing impairment    mild hearing loss   Hyperlipidemia    Hypertension   [2] Past Surgical History: Procedure Laterality Date   CESAREAN SECTION  1980   PR COLONOSCOPY FLX DX W/COLLJ SPEC WHEN PFRMD N/A 02/16/2018   Procedure: COLONOSCOPY, FLEXIBLE, PROXIMAL TO SPLENIC FLEXURE; DIAGNOSTIC, W/WO COLLECTION SPECIMEN BY BRUSH OR WASH;  Surgeon: Dorn Lynwood Lauth, MD;  Location: [facility];  Service: Gastroenterology   PR COLONOSCOPY W/BIOPSY SINGLE/MULTIPLE N/A 07/28/2012   Procedure: COLONOSCOPY, FLEXIBLE, PROXIMAL TO SPLENIC FLEXURE; WITH BIOPSY, SINGLE OR MULTIPLE;  Surgeon: Eleanor CHRISTELLA Sorrel, MD;  Location: [facility];  Service: Gastroenterology  [  3] Family History Problem Relation Age of Onset   Heart attack Father 101   Heart failure Mother    Breast cancer Sister 58  [4] Social History Tobacco Use   Smoking status: Former   Smokeless tobacco: Never  [employer]vertising [employer]   Vaping status: Never Used  Substance Use Topics   Alcohol use: Yes    Comment: 1 glass of wine twice a month   Drug use: No  [5] Current Outpatient Medications  Medication Sig Dispense  Refill   amlodipine (NORVASC) 10 MG tablet Take 1 tablet (10 mg total) by mouth daily. 90 tablet 3   ascorbic acid (VITAMIN C ORAL) Take 1 tablet by mouth daily.     cholecalciferol, vitamin D3, 1,000 unit capsule Take 1 capsule (25 mcg total) by mouth daily.     cyanocobalamin 1000 MCG tablet Take 1 tablet (1,000 mcg total) by mouth daily.     enalapril (VASOTEC) 10 MG tablet Take 1 tablet (10 mg total) by mouth two (2) times a day. 180 tablet 3   ferrous gluconate 324 MG tablet Take 1 tablet (324 mg total) by mouth in the morning.     hydroCHLOROthiazide (HYDRODIURIL) 25 MG tablet Take 1 tablet (25 mg total) by mouth daily. 90 tablet 3   pravastatin (PRAVACHOL) 40 MG tablet Take 1 tablet (40 mg total) by mouth daily. 90 tablet 3   ELDERBERRY FRUIT ORAL One gummie daily (Patient not taking: Reported on 01/24/2024)     fluticasone propionate (FLONASE) 50 mcg/actuation nasal spray 1 spray into each nostril two (2) times a day. (Patient not taking: Reported on 01/24/2024) 16 g 2   No current facility-administered medications for this visit.

## 2024-02-27 ENCOUNTER — Other Ambulatory Visit: Payer: Self-pay

## 2024-02-27 ENCOUNTER — Emergency Department
Admission: EM | Admit: 2024-02-27 | Discharge: 2024-02-27 | Disposition: A | Discharge status: Home / Self Care | Attending: Emergency Medicine | Admitting: Emergency Medicine

## 2024-02-27 DIAGNOSIS — N390 Urinary tract infection, site not specified: Secondary | ICD-10-CM | POA: Diagnosis not present

## 2024-02-27 DIAGNOSIS — I10 Essential (primary) hypertension: Secondary | ICD-10-CM | POA: Diagnosis not present

## 2024-02-27 DIAGNOSIS — D649 Anemia, unspecified: Secondary | ICD-10-CM | POA: Diagnosis not present

## 2024-02-27 DIAGNOSIS — R55 Syncope and collapse: Secondary | ICD-10-CM | POA: Diagnosis present

## 2024-02-27 DIAGNOSIS — R7402 Elevation of levels of lactic acid dehydrogenase (LDH): Secondary | ICD-10-CM | POA: Diagnosis not present

## 2024-02-27 LAB — COMPREHENSIVE METABOLIC PANEL WITH GFR
ALT: 11 U/L (ref 0–44)
AST: 27 U/L (ref 15–41)
Albumin: 4.2 g/dL (ref 3.5–5.0)
Alkaline Phosphatase: 57 U/L (ref 38–126)
Anion gap: 12 (ref 5–15)
BUN: 13 mg/dL (ref 8–23)
CO2: 25 mmol/L (ref 22–32)
Calcium: 10.3 mg/dL (ref 8.9–10.3)
Chloride: 97 mmol/L — ABNORMAL LOW (ref 98–111)
Creatinine, Ser: 0.78 mg/dL (ref 0.44–1.00)
GFR, Estimated: >60 mL/min (ref >60)
Glucose, Bld: 106 mg/dL — ABNORMAL HIGH (ref 70–99)
Potassium: 3.3 mmol/L — ABNORMAL LOW (ref 3.5–5.1)
Sodium: 134 mmol/L — ABNORMAL LOW (ref 135–145)
Total Bilirubin: 0.4 mg/dL (ref 0.0–1.2)
Total Protein: 7.8 g/dL (ref 6.5–8.1)

## 2024-02-27 LAB — CBC
HCT: 33 % — ABNORMAL LOW (ref 36.0–46.0)
Hemoglobin: 10.7 g/dL — ABNORMAL LOW (ref 12.0–15.0)
MCH: 27.9 pg (ref 26.0–34.0)
MCHC: 32.4 g/dL (ref 30.0–36.0)
MCV: 85.9 fL (ref 80.0–100.0)
Platelets: 417 K/uL — ABNORMAL HIGH (ref 150–400)
RBC: 3.84 MIL/uL — ABNORMAL LOW (ref 3.87–5.11)
RDW: 14.2 % (ref 11.5–15.5)
WBC: 6.8 K/uL (ref 4.0–10.5)
nRBC: 0 % (ref 0.0–0.2)

## 2024-02-27 LAB — URINALYSIS, ROUTINE W REFLEX MICROSCOPIC
Bacteria, UA: NONE SEEN
Bilirubin Urine: NEGATIVE
Glucose, UA: NEGATIVE mg/dL
Hgb urine dipstick: NEGATIVE
Ketones, ur: 5 mg/dL — AB
Nitrite: NEGATIVE
Protein, ur: NEGATIVE mg/dL
Specific Gravity, Urine: 1.014 (ref 1.005–1.030)
pH: 6 (ref 5.0–8.0)

## 2024-02-27 LAB — LACTIC ACID, PLASMA
Lactic Acid, Venous: 2.5 mmol/L (ref 0.5–1.9)
Lactic Acid, Venous: 1.3 mmol/L (ref 0.5–1.9)

## 2024-02-27 LAB — TROPONIN T, HIGH SENSITIVITY
Troponin T High Sensitivity: <15 ng/L (ref 0–19)
Troponin T High Sensitivity: <15 ng/L (ref 0–19)

## 2024-02-27 MED ORDER — SODIUM CHLORIDE 0.9 % IV BOLUS
500.0000 mL | Freq: Once | INTRAVENOUS | Status: AC
  Administered 2024-02-27: 500 mL via INTRAVENOUS

## 2024-02-27 MED ORDER — CEPHALEXIN 500 MG PO CAPS: 500.0000 mg | ORAL_CAPSULE | Freq: Two times a day (BID) | ORAL | 0 refills | Status: AC

## 2024-02-27 MED ORDER — CEPHALEXIN 500 MG PO CAPS
500.0000 mg | ORAL_CAPSULE | Freq: Once | ORAL | Status: AC
  Administered 2024-02-27: 500 mg via ORAL
  Filled 2024-02-27: qty 1

## 2024-02-27 NOTE — ED Notes (Addendum)
 Son brought pt up to desk and felt she might have passed out again. Pt  responding to this RN. Pt vitals checked and BP low. Pt also had episode of vomit earlier.  Pt acuity changed due to new change. Charge RN called and pt to start in flex for now.

## 2024-02-27 NOTE — ED Notes (Signed)
Son and daughter in law at bedside.

## 2024-02-27 NOTE — ED Notes (Signed)
 Lab called and said the troponin hemolyzed, this was the second draw sent to lab that was no good, told lab to send someone to draw the blood, Crystal RN witnessed tube was full to the line and not hemolyzed upon sending

## 2024-02-27 NOTE — ED Provider Notes (Signed)
 [facility] Provider Note    Event Date/Time   First MD Initiated Contact with Patient 02/27/24 1341     (approximate)   History   Near Syncope   HPI  Natalie Mullins is a 78 y.o. female with a history of hypertension and hyperlipidemia who presents with a syncopal episode while at church.  Per the family members, the patient is syncopal episode while at church several months back, and had become anxious about going to church.  She had not been attending until today when she decided to go.  She started to feel lightheaded and dizzy and asked to be taken outside.  She then tried to step outside on her own, but started to get weak.  She ended up losing consciousness, but was held and let down gently by bystanders.  She did not hit her head.  Subsequently while in the ED waiting room she had an episode of vomiting.  She then had a second episode of lightheadedness and fell like she might pass out although did not pass out fully.  She denies any chest pain or difficulty breathing.  She has no fever or chills.  She denies any diarrhea or abdominal pain.  She states she is feeling fine now.  She denies any changes to her routine today and states that she ate breakfast normally.  I reviewed the past medical records.  The patient's most recent outpatient encounter was on 11/10 with family medicine for follow-up of her chronic conditions.   Physical Exam   Triage Vital Signs: ED Triage Vitals  Encounter Vitals Group     BP 02/27/24 1219 (!) 111/46     Girls Systolic BP Percentile --      Girls Diastolic BP Percentile --      Boys Systolic BP Percentile --      Boys Diastolic BP Percentile --      Pulse Rate 02/27/24 1219 (!) 55     Resp 02/27/24 1219 16     Temp 02/27/24 1219 97.6 F (36.4 C)     Temp Source 02/27/24 1219 Oral     SpO2 02/27/24 1219 100 %     Weight 02/27/24 1219 116 lb (52.6 kg)     Height 02/27/24 1219 5' 4 (1.626 m)     Head Circumference --       Peak Flow --      Pain Score 02/27/24 1159 0     Pain Loc --      Pain Education --      Exclude from Growth Chart --     Most recent vital signs: Vitals:   02/27/24 1700 02/27/24 1730  BP: (!) 132/56 (!) 140/50  Pulse: 73 72  Resp: 20 20  Temp:    SpO2: 100% 100%     General: Alert, well-appearing, no distress.  CV:  Good peripheral perfusion.  Resp:  Normal effort.  Lungs CTAB. Abd:  Left and nontender.  No distention.  Other:  EOMI.  PERRLA.  No photophobia.  No facial droop.  Normal speech.  Motor intact in all extremities.  No ataxia.   ED Results / Procedures / Treatments   Labs (all labs ordered are listed, but only abnormal results are displayed) Labs Reviewed  COMPREHENSIVE METABOLIC PANEL WITH GFR - Abnormal; Notable for the following components:      Result Value   Sodium 134 (*)    Potassium 3.3 (*)    Chloride 97 (*)  Glucose, Bld 106 (*)    All other components within normal limits  CBC - Abnormal; Notable for the following components:   RBC 3.84 (*)    Hemoglobin 10.7 (*)    HCT 33.0 (*)    Platelets 417 (*)    All other components within normal limits  URINALYSIS, ROUTINE W REFLEX MICROSCOPIC - Abnormal; Notable for the following components:   Color, Urine YELLOW (*)    APPearance HAZY (*)    Ketones, ur 5 (*)    Leukocytes,Ua LARGE (*)    All other components within normal limits  LACTIC ACID, PLASMA - Abnormal; Notable for the following components:   Lactic Acid, Venous 2.5 (*)    All other components within normal limits  LACTIC ACID, PLASMA  CBG MONITORING, ED  TROPONIN T, HIGH SENSITIVITY  TROPONIN T, HIGH SENSITIVITY     EKG  ED ECG REPORT I, Waylon Cassis, the attending physician, personally viewed and interpreted this ECG.  Date: 02/27/2024 EKG Time: 1223 Rate: 49 Rhythm: normal sinus rhythm QRS Axis: normal Intervals: LBBB ST/T Wave abnormalities: normal Narrative Interpretation: no evidence of acute ischemia;  no significant change when compared to EKG of 04/16/2023    RADIOLOGY    PROCEDURES:  Critical Care performed: No  Procedures   MEDICATIONS ORDERED IN ED: Medications  cephALEXin  (KEFLEX ) capsule 500 mg (has no administration in time range)  sodium chloride  0.9 % bolus 500 mL (0 mLs Intravenous Stopped 02/27/24 1536)  sodium chloride  0.9 % bolus 500 mL (0 mLs Intravenous Stopped 02/27/24 1631)     IMPRESSION / MDM / ASSESSMENT AND PLAN / ED COURSE  I reviewed the triage vital signs and the nursing notes.  78 year old female with PMH as noted above presents with syncopal episode while at church and subsequent recurrent near syncope while in the ED waiting room, now asymptomatic.  She was hypotensive shortly after her initial arrival to the ED during that second episode.  Blood pressure has normalized without intervention.  Otherwise her physical exam is unremarkable for acute findings.  Neurologic exam is nonfocal.  Differential diagnosis includes, but is not limited to, vasovagal syncope, dehydration, electrolyte abnormality, hypoglycemia, other metabolic cause, UTI, viral syndrome, other acute infection/sepsis, less likely cardiac dysrhythmia.  There is no evidence of CNS cause.  We will give a fluid bolus, obtain lab workup, and reassess.  Patient's presentation is most consistent with acute presentation with potential threat to life or bodily function.  The patient is on the cardiac monitor to evaluate for evidence of arrhythmia and/or significant heart rate changes.  ----------------------------------------- 7:13 PM on 02/27/2024 -----------------------------------------  Lab workup is overall reassuring.  CMP shows no acute findings.  CBC shows no leukocytosis and mild chronic anemia.  Initial lactate was minimally elevated but this.  After fluids.  There is no clinical evidence of sepsis.  The patient's blood pressure has remained stable.  Troponins are negative x  2.  Urinalysis does show possible findings of UTI with 21-50 WBCs.  I considered whether this patient may benefit from inpatient admission for further observation for syncope, given her age and the earlier hypotension that has resolved.  I offered admission to the patient versus discharge.  She strongly prefers to go home.  She is stable for discharge at this time.  I counseled her and her family member on the results of the workup.  I answered all of their questions.  I will prescribe a course of Keflex  to cover for UTI.  I gave strict return precautions, and they expressed understanding.    FINAL CLINICAL IMPRESSION(S) / ED DIAGNOSES   Final diagnoses:  Syncope, unspecified syncope type  Urinary tract infection without hematuria, site unspecified     Rx / DC Orders   ED Discharge Orders          Ordered    cephALEXin  (KEFLEX ) 500 MG capsule  2 times daily        02/27/24 1912             Note:  This document was prepared using Dragon voice recognition software and may include unintentional dictation errors.    Jacolyn Pae, MD 02/27/24 [phone]

## 2024-02-27 NOTE — Discharge Instructions (Signed)
 Take the antibiotic as prescribed.  Make sure to drink plenty of fluids.  Follow-up with your primary care provider.  Return to the ER for new, worsening, or persistent episodes of feeling dizzy or lightheaded, passing out, fever, weakness, or any other new or worsening symptoms that concern you.

## 2024-02-27 NOTE — ED Triage Notes (Signed)
 Pt comes via EMS from [place] with near syncopal. 62/30 first BP, HR 765 100% RA  Last BP 140/50  Pt denies any cp or discomfort just feeing really tired.

## 2024-02-27 NOTE — ED Notes (Signed)
Patient is hard of hearing
